# Patient Record
Sex: Male | Born: 1980 | Hispanic: Yes | Marital: Married
Health system: Southern US, Community
[De-identification: ages and names within clinical notes are randomized; demographics above are authoritative.]

---

## 2018-10-20 ENCOUNTER — Other Ambulatory Visit: Payer: Self-pay

## 2018-10-20 ENCOUNTER — Emergency Department: Payer: Worker's Compensation

## 2018-10-20 ENCOUNTER — Encounter: Payer: Self-pay | Admitting: Emergency Medicine

## 2018-10-20 ENCOUNTER — Emergency Department
Admission: EM | Admit: 2018-10-20 | Discharge: 2018-10-20 | Disposition: A | Discharge status: Home / Self Care | Payer: Worker's Compensation | Attending: Emergency Medicine | Admitting: Emergency Medicine

## 2018-10-20 DIAGNOSIS — M25551 Pain in right hip: Secondary | ICD-10-CM | POA: Diagnosis not present

## 2018-10-20 DIAGNOSIS — W11XXXA Fall on and from ladder, initial encounter: Secondary | ICD-10-CM

## 2018-10-20 MED ORDER — OXYCODONE HCL 5 MG PO TABS
5.0000 mg | ORAL_TABLET | Freq: Once | ORAL | Status: AC
  Administered 2018-10-20: 5 mg via ORAL
  Filled 2018-10-20: qty 1

## 2018-10-20 MED ORDER — TRAMADOL HCL 50 MG PO TABS: 50.0000 mg | ORAL_TABLET | Freq: Four times a day (QID) | ORAL | 0 refills | Status: AC | PRN

## 2018-10-20 MED ORDER — ACETAMINOPHEN 500 MG PO TABS
1000.0000 mg | ORAL_TABLET | Freq: Once | ORAL | Status: AC
  Administered 2018-10-20: 13:00:00 1000 mg via ORAL
  Filled 2018-10-20: qty 2

## 2018-10-20 NOTE — Discharge Instructions (Addendum)
You were seen in the emergency department after a fall. Luckily all of your imaging studies did not show any evidence of injuries. Follow-up with you doctor within the next 2-3 days for further evaluation. Sometimes injuries can present at a later time and therefore it is imperative that you return to the emergency room if you have a severe headache, facial droop, neck pain, numbness or weakness of your extremities, slurred speech, difficulty finding words, chest pain, back pain, abdominal pain, or any other new symptoms that were not present during this visit. You may take Tylenol 1000mg  every 8 hours and 50mg  of tramadol as needed for breakthrough pain

## 2018-10-20 NOTE — ED Triage Notes (Signed)
Pt presents to ED via POV with c/o fall from [facility]. Per [facility] RN pt fell approx 74ft approx 1 hr PTA. Pt c/o lower back pain, pain worse with breathing. Pt denies pain with palpation of C-spine, pt c/o pain with palpation of lower back. Pt also c/o pain with walker, no LOC at this time. Pt is alert and oriented at this time.

## 2018-10-20 NOTE — ED Notes (Signed)
This RN contacted Josh at [employer] about pt's condition and WC requests. Denies needing any bloodwork or urine.

## 2018-10-20 NOTE — ED Notes (Signed)
Patient transported to CT 

## 2018-10-20 NOTE — ED Provider Notes (Signed)
[facility] Emergency Department Provider Note  ____________________________________________  Time seen: Approximately 1:06 PM  I have reviewed the triage vital signs and the nursing notes.   HISTORY  Chief Complaint Fall   HPI Tyler Short is a 38 y.o. male no significant past medical history who presents for evaluation after mechanical fall.  Patient reports that he was walking down a 10 foot ladder with a heavy backpack when he lost his balance and fell backwards.  He reports falling on top of the backpack onto a wood floor.  He denies head trauma.  He is complaining of left upper back pain and right lower back pain.  Upper back pain is mild, the lower back pain is moderate and worse with movement of his right lower extremity.  He denies midline back pain, he denies chest pain or abdominal pain, he denies any extremity pain, he denies headache or neck pain.  PMH None - reviewed  Allergies Patient has no known allergies.  No family history on file.  Social History Smoking - no Drugs - no Alcohol - no  Review of Systems Constitutional: Negative for fever. Eyes: Negative for visual changes. ENT: Negative for facial injury or neck injury Cardiovascular: Negative for chest injury. Respiratory: Negative for shortness of breath. Negative for chest wall injury. Gastrointestinal: Negative for abdominal pain or injury. Genitourinary: Negative for dysuria. Musculoskeletal: + back pain, negative for arm or leg pain. Skin: Negative for laceration/abrasions. Neurological: Negative for head injury.  ____________________________________________   PHYSICAL EXAM:  VITAL SIGNS: ED Triage Vitals  Enc Vitals Group     BP 10/20/18 1223 112/75     Pulse Rate 10/20/18 1223 63     Resp 10/20/18 1223 18     Temp 10/20/18 1223 97.9 F (36.6 C)     Temp Source 10/20/18 1223 Oral     SpO2 10/20/18 1223 99 %     Weight 10/20/18 1222 190 lb  (86.2 kg)     Height 10/20/18 1222 5\' 6"  (1.676 m)     Head Circumference --      Peak Flow --      Pain Score 10/20/18 1222 8     Pain Loc --      Pain Edu? --      Excl. in GC? --    Full spinal precautions maintained throughout the trauma exam. Constitutional: Alert and oriented. No acute distress. Does not appear intoxicated. HEENT Head: Normocephalic and atraumatic. Face: No facial bony tenderness. Stable midface Ears: No hemotympanum bilaterally. No Battle sign Eyes: No eye injury. PERRL. No raccoon eyes Nose: Nontender. No epistaxis. No rhinorrhea Mouth/Throat: Mucous membranes are moist. No oropharyngeal blood. No dental injury. Airway patent without stridor. Normal voice. Neck: no C-collar in place. No midline c-spine tenderness.  Cardiovascular: Normal rate, regular rhythm. Normal and symmetric distal pulses are present in all extremities. Pulmonary/Chest: Chest wall is stable and nontender to palpation/compression. Normal respiratory effort. Breath sounds are normal. No crepitus. Tender to palpation over the L lateral upper back Abdominal: Soft, nontender, non distended. Musculoskeletal: Nontender with normal full range of motion in all extremities. No deformities. No thoracic or lumbar midline spinal tenderness. Tender to palpation on the R lower paraspinal region. Pelvis is stable. Skin: Skin is warm, dry and intact. No abrasions or contutions. Psychiatric: Speech and behavior are appropriate. Neurological: Normal speech and language. Moves all extremities to command. No gross focal neurologic deficits are appreciated.  Glascow Coma Score: 4 -  Opens eyes on own 6 - Follows simple motor commands 5 - Alert and oriented GCS: 15   ____________________________________________   LABS (all labs ordered are listed, but only abnormal results are displayed)  Labs Reviewed - No data to display ____________________________________________  EKG  none   ____________________________________________  RADIOLOGY  I have personally reviewed the images performed during this visit and I agree with the Radiologist's read.   Interpretation by Radiologist:  Dg Chest 2 View  Result Date: 10/20/2018 CLINICAL DATA:  Back pain after fall. EXAM: CHEST - 2 VIEW COMPARISON:  None. FINDINGS: The heart size and mediastinal contours are within normal limits. Both lungs are clear. No pneumothorax or pleural effusion is noted. The visualized skeletal structures are unremarkable. IMPRESSION: No active cardiopulmonary disease. Electronically Signed   By: Lupita Raider, M.D.   On: 10/20/2018 13:41   Dg Lumbar Spine Complete  Result Date: 10/20/2018 CLINICAL DATA:  Back pain after fall. EXAM: LUMBAR SPINE - COMPLETE 4+ VIEW COMPARISON:  None. FINDINGS: There is no evidence of lumbar spine fracture. Alignment is normal. Minimal anterior osteophyte formation is noted at L2-3, L3-4 and L4-5. Intervertebral disc spaces are maintained. IMPRESSION: Minimal degenerative changes as described above. No acute abnormality seen in the lumbar spine. Electronically Signed   By: Lupita Raider, M.D.   On: 10/20/2018 13:42   Ct Head Wo Contrast  Result Date: 10/20/2018 CLINICAL DATA:  38 year old male s/p 10 foot fall EXAM: CT HEAD WITHOUT CONTRAST TECHNIQUE: Contiguous axial images were obtained from the base of the skull through the vertex without intravenous contrast. COMPARISON:  None. FINDINGS: Brain: No evidence of acute infarction, hemorrhage, hydrocephalus, extra-axial collection or mass lesion/mass effect. Vascular: No hyperdense vessel or unexpected calcification. Skull: Normal. Negative for fracture or focal lesion. Sinuses/Orbits: No acute finding. Other: None. IMPRESSION: Negative head CT. Electronically Signed   By: Malachy Moan M.D.   On: 10/20/2018 13:24   Dg Hip Unilat W Or Wo Pelvis 2-3 Views Right  Result Date: 10/20/2018 CLINICAL DATA:  Right hip pain after  fall. EXAM: DG HIP (WITH OR WITHOUT PELVIS) 2-3V RIGHT COMPARISON:  None. FINDINGS: There is no evidence of hip fracture or dislocation. There is no evidence of arthropathy or other focal bone abnormality. IMPRESSION: Negative. Electronically Signed   By: Lupita Raider, M.D.   On: 10/20/2018 13:43      ____________________________________________   PROCEDURES  Procedure(s) performed: None Procedures Critical Care performed:  None ____________________________________________   INITIAL IMPRESSION / ASSESSMENT AND PLAN / ED COURSE   38 y.o. male no significant past medical history who presents for evaluation after mechanical fall.  Patient fell 10 feet from a ladder backwards on top of the heavy backpack onto the wood flooring.  No LOC.  X-rays of chest, lumbar spine, pelvis and right hip and CT head are pending.    _________________________ 1:56 PM on 10/20/2018 -----------------------------------------  Imaging with no acute findings.  Patient is able to ambulate with well-controlled pain.  Will discharge home on standing Tylenol, tramadol as needed, and follow-up with primary care doctor.  Discussed my standard return precautions with patient.   As part of my medical decision making, I reviewed the following data within the electronic MEDICAL RECORD NUMBER Nursing notes reviewed and incorporated, Old chart reviewed, Radiograph reviewed , Notes from prior ED visits and [place] Controlled Substance Database    Pertinent labs & imaging results that were available during my care of the patient were reviewed by  me and considered in my medical decision making (see chart for details).    ____________________________________________   FINAL CLINICAL IMPRESSION(S) / ED DIAGNOSES  Final diagnoses:  Fall from ladder, initial encounter  Pain of right hip joint      NEW MEDICATIONS STARTED DURING THIS VISIT:  ED Discharge Orders         Ordered    traMADol (ULTRAM) 50 MG tablet  Every 6  hours PRN     10/20/18 1355           Note:  This document was prepared using Dragon voice recognition software and may include unintentional dictation errors.    Don Perking, [place], MD 10/20/18 1356

## 2019-10-24 ENCOUNTER — Ambulatory Visit: Payer: Self-pay | Attending: Internal Medicine

## 2019-10-24 DIAGNOSIS — Z23 Encounter for immunization: Secondary | ICD-10-CM

## 2019-10-24 NOTE — Progress Notes (Signed)
   Covid-19 Vaccination Clinic  Name:  Tyler Short    MRN: [id number] DOB: 1981-06-23  10/24/2019  Mr. Tyler Short was observed post Covid-19 immunization for 15 minutes without incident. He was provided with Vaccine Information Sheet and instruction to access the V-Safe system.   Mr. Tyler Short was instructed to call 911 with any severe reactions post vaccine: Marland Kitchen Difficulty breathing  . Swelling of face and throat  . A fast heartbeat  . A bad rash all over body  . Dizziness and weakness   Immunizations Administered    Name Date Dose VIS Date Route   Pfizer COVID-19 Vaccine 10/24/2019  1:43 PM 0.3 mL 07/01/2019 Intramuscular   Manufacturer: [employer], [employer]   Lot: [phone] [id number]   NDC: [address]-5

## 2019-11-14 ENCOUNTER — Ambulatory Visit: Payer: Self-pay | Attending: Internal Medicine

## 2019-11-14 DIAGNOSIS — Z23 Encounter for immunization: Secondary | ICD-10-CM

## 2019-11-14 NOTE — Progress Notes (Signed)
   Covid-19 Vaccination Clinic  Name:  Tyler Short    MRN: [id number] DOB: [birth year]  11/14/2019  Mr. Tyler Short was observed post Covid-19 immunization for 15 minutes without incident. He was provided with Vaccine Information Sheet and instruction to access the V-Safe system.   Mr. Tyler Short was instructed to call 911 with any severe reactions post vaccine: Tyler Short Kitchen Difficulty breathing  . Swelling of face and throat  . A fast heartbeat  . A bad rash all over body  . Dizziness and weakness   Immunizations Administered    Name Date Dose VIS Date Route   Pfizer COVID-19 Vaccine 11/14/2019  1:37 PM 0.3 mL 09/14/2018 Intramuscular   Manufacturer: [employer], [employer]   Lot: [id number]   NDC: [address]-9

## 2020-08-21 IMAGING — CR CHEST - 2 VIEW
2 series · 2 of 2 positions shown · non-contrast
Comparison: None.

CLINICAL DATA: Back pain after fall.

EXAM:
CHEST - 2 VIEW

[chest pa]
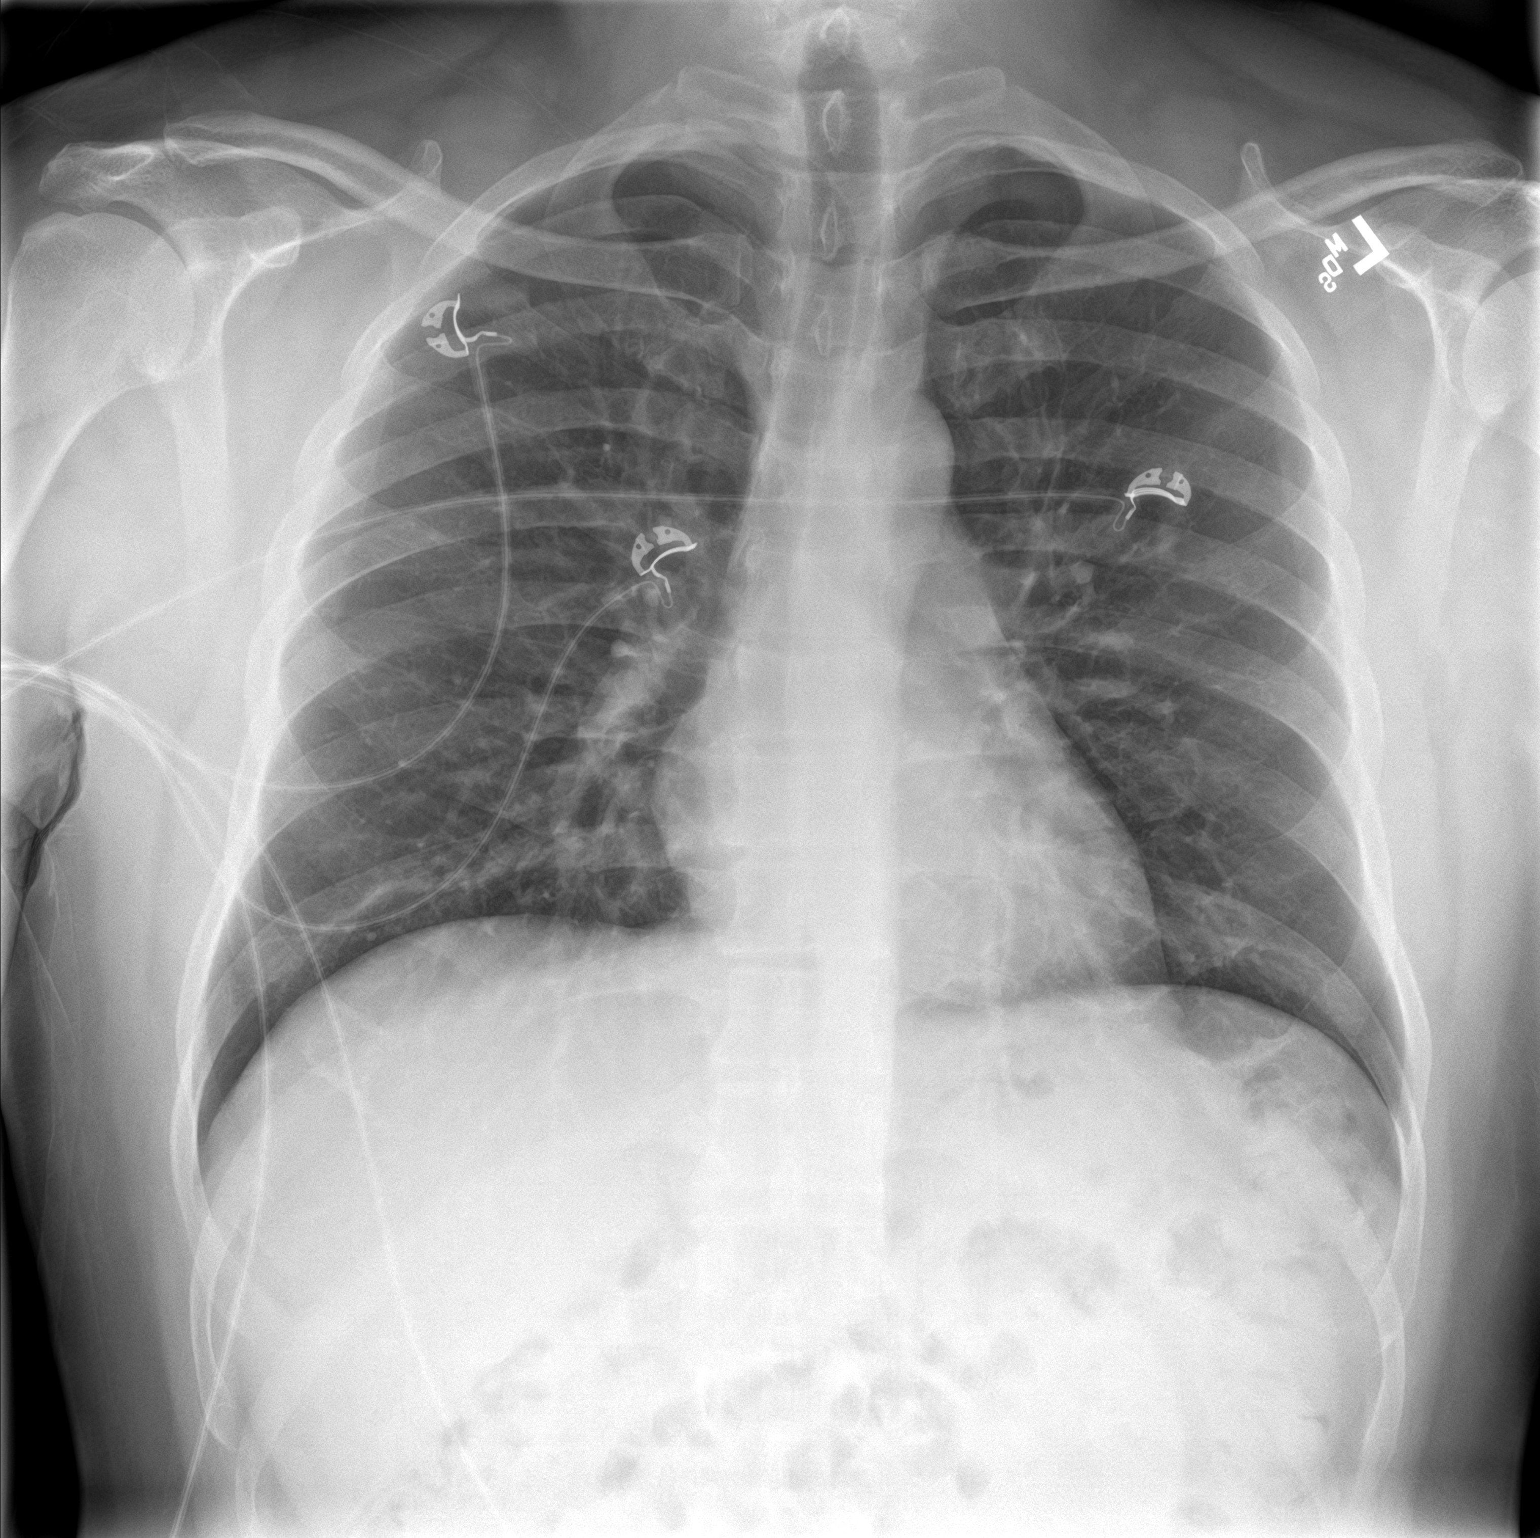

[chest lat]
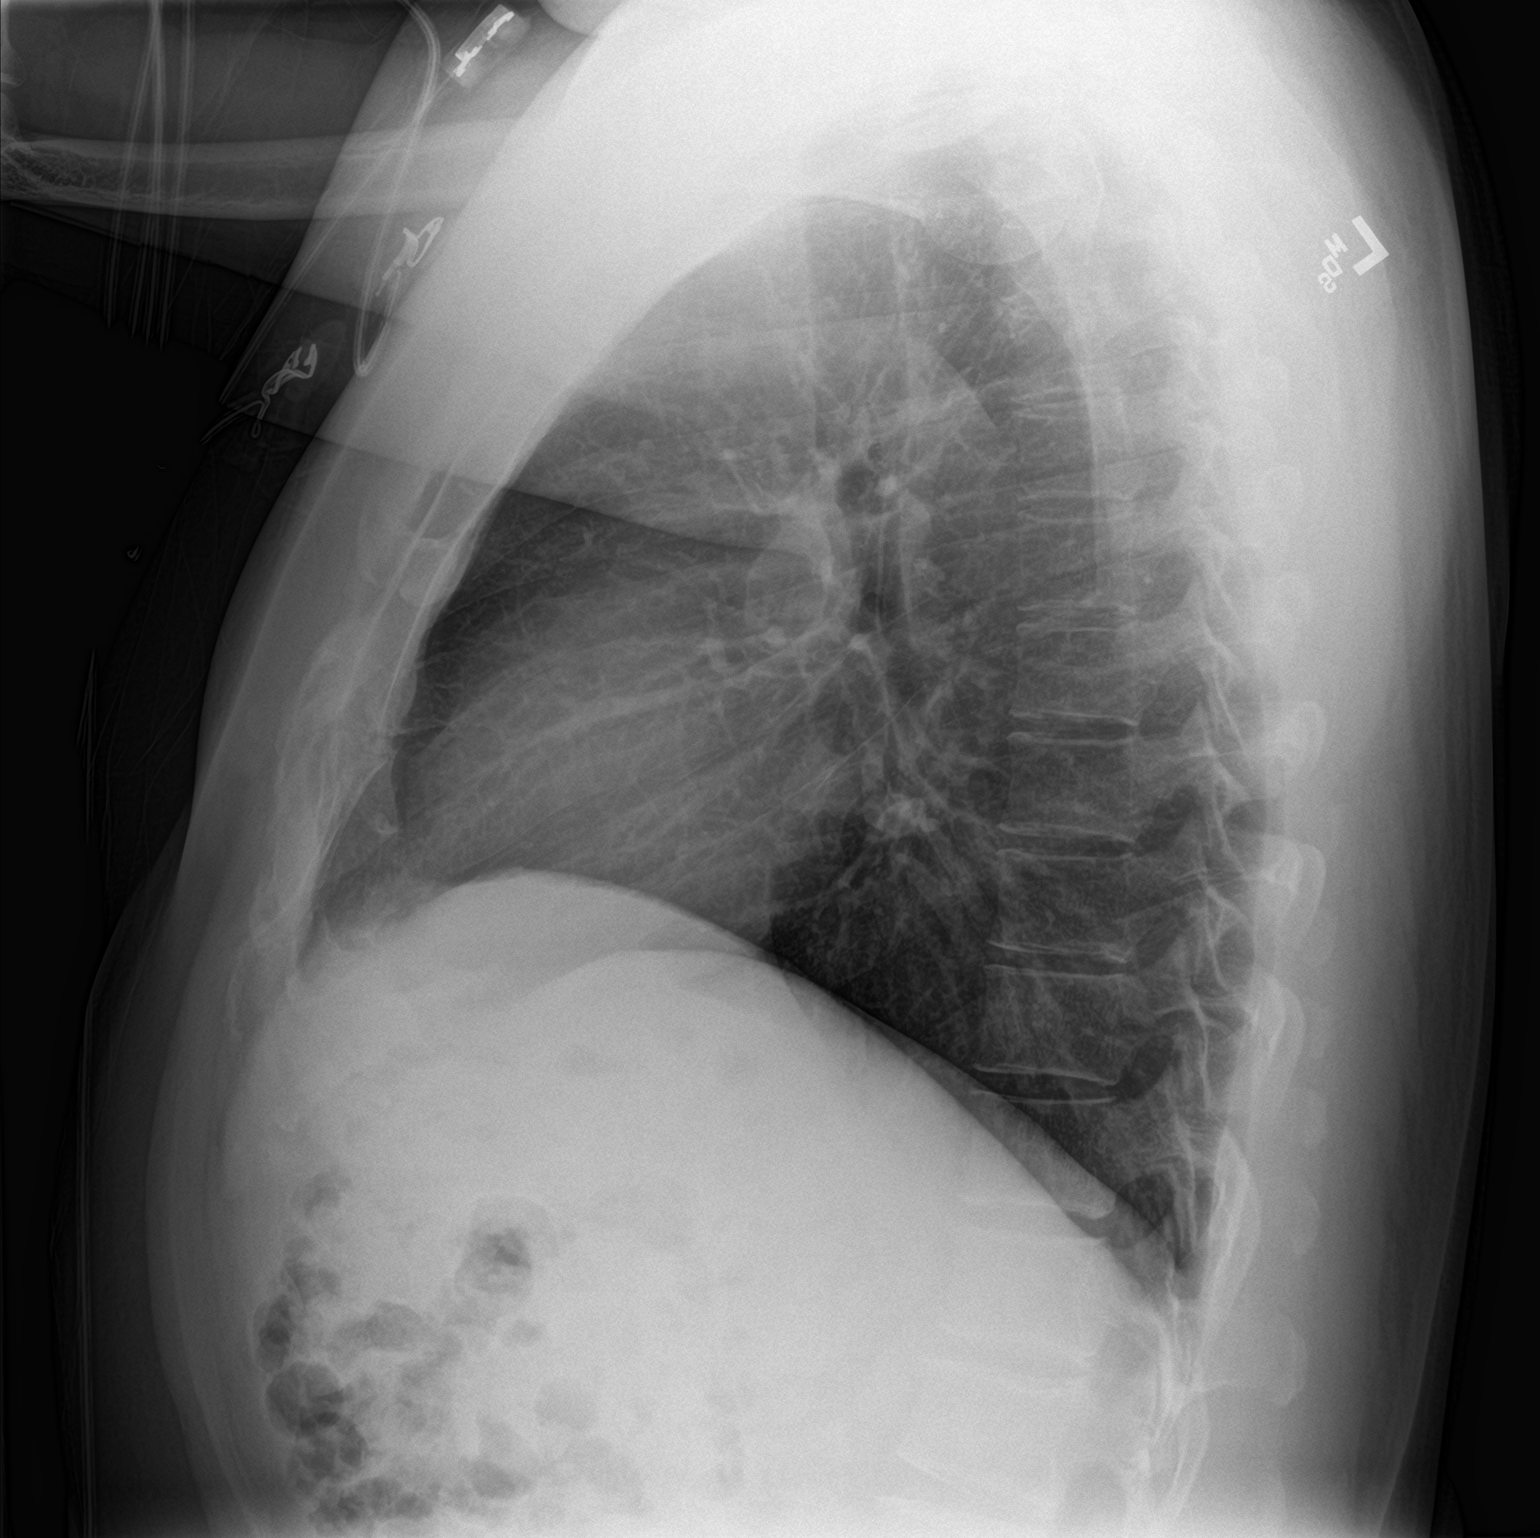

[2 of 2 positions shown; findings below may reference images not displayed]

FINDINGS: The heart size and mediastinal contours are within normal limits.
Both lungs are clear. No pneumothorax or pleural effusion is noted.
The visualized skeletal structures are unremarkable.
IMPRESSION: No active cardiopulmonary disease.

## 2023-07-19 ENCOUNTER — Emergency Department
Admission: EM | Admit: 2023-07-19 | Discharge: 2023-07-19 | Disposition: A | Discharge status: Home / Self Care | Payer: BC Managed Care – PPO | Attending: Student in an Organized Health Care Education/Training Program | Admitting: Student in an Organized Health Care Education/Training Program

## 2023-07-19 ENCOUNTER — Other Ambulatory Visit: Payer: Self-pay

## 2023-07-19 DIAGNOSIS — Y9389 Activity, other specified: Secondary | ICD-10-CM | POA: Diagnosis not present

## 2023-07-19 DIAGNOSIS — S6992XA Unspecified injury of left wrist, hand and finger(s), initial encounter: Secondary | ICD-10-CM | POA: Diagnosis present

## 2023-07-19 DIAGNOSIS — W260XXA Contact with knife, initial encounter: Secondary | ICD-10-CM | POA: Diagnosis not present

## 2023-07-19 DIAGNOSIS — S61412A Laceration without foreign body of left hand, initial encounter: Secondary | ICD-10-CM | POA: Diagnosis not present

## 2023-07-19 MED ORDER — LIDOCAINE HCL (PF) 1 % IJ SOLN
5.0000 mL | Freq: Once | INTRAMUSCULAR | Status: AC
  Administered 2023-07-19: 5 mL via INTRADERMAL
  Filled 2023-07-19: qty 5

## 2023-07-19 NOTE — ED Provider Notes (Signed)
[facility] Provider Note    Event Date/Time   First MD Initiated Contact with Patient 07/19/23 2205     (approximate)   History   Laceration   HPI  Tyler Short is a 42 y.o. male with no PMH who presents for evaluation of laceration to the left hand.  Patient was using a kitchen knife to open a package when he cut the thenar eminence of his left hand.  Actively bleeding.  Patient is up-to-date on his tetanus shot.      Physical Exam   Triage Vital Signs: ED Triage Vitals  Encounter Vitals Group     BP 07/19/23 1952 (!) 145/101     Systolic BP Percentile --      Diastolic BP Percentile --      Pulse Rate 07/19/23 1952 100     Resp 07/19/23 1952 20     Temp 07/19/23 1952 97.9 F (36.6 C)     Temp Source 07/19/23 1952 Oral     SpO2 07/19/23 1952 100 %     Weight 07/19/23 1951 200 lb (90.7 kg)     Height 07/19/23 1951 5\' 7"  (1.702 m)     Head Circumference --      Peak Flow --      Pain Score 07/19/23 1951 8     Pain Loc --      Pain Education --      Exclude from Growth Chart --     Most recent vital signs: Vitals:   07/19/23 1952  BP: (!) 145/101  Pulse: 100  Resp: 20  Temp: 97.9 F (36.6 C)  SpO2: 100%   General: Awake, no distress.  CV:  Good peripheral perfusion.  Resp:  Normal effort.  Abd:  No distention.  Other:  Approximately 2 cm laceration to the thenar eminence of the left hand.  Hand ROM maintained, radial pulse 2+ and regular, capillary refill in left thumb appropriate.   ED Results / Procedures / Treatments   Labs (all labs ordered are listed, but only abnormal results are displayed) Labs Reviewed - No data to display   PROCEDURES:  Critical Care performed: No  .Laceration Repair  Date/Time: 07/19/2023 10:39 PM  Performed by: Cameron Ali, PA-C Authorized by: Cameron Ali, PA-C   Consent:    Consent obtained:  Verbal   Consent given by:  Patient   Risks, benefits, and  alternatives were discussed: yes     Risks discussed:  Infection, pain, poor cosmetic result and poor wound healing   Alternatives discussed:  No treatment Universal protocol:    Patient identity confirmed:  Verbally with patient Anesthesia:    Anesthesia method:  Local infiltration   Local anesthetic:  Lidocaine 1% w/o epi Laceration details:    Location:  Hand   Hand location:  L palm   Length (cm):  2   Depth (mm):  3 Pre-procedure details:    Preparation:  Patient was prepped and draped in usual sterile fashion Exploration:    Wound exploration: entire depth of wound visualized   Treatment:    Area cleansed with:  Povidone-iodine   Amount of cleaning:  Standard   Irrigation solution:  Sterile saline   Irrigation volume:  30 mL   Irrigation method:  Syringe Skin repair:    Repair method:  Sutures   Suture size:  4-0   Suture material:  Prolene   Suture technique:  Simple interrupted   Number of  sutures:  5 Approximation:    Approximation:  Close Repair type:    Repair type:  Simple Post-procedure details:    Dressing:  Adhesive bandage   Procedure completion:  Tolerated well, no immediate complications    MEDICATIONS ORDERED IN ED: Medications  lidocaine (PF) (XYLOCAINE) 1 % injection 5 mL (has no administration in time range)     IMPRESSION / MDM / ASSESSMENT AND PLAN / ED COURSE  I reviewed the triage vital signs and the nursing notes.                             42 year old male presents for evaluation of laceration to the left hand.  Patient was hypertensive in triage otherwise vital signs are stable.  Patient NAD on exam.  Differential diagnosis includes, but is not limited to, laceration.  Patient's presentation is most consistent with acute, uncomplicated illness.  Laceration repaired as described in the procedure note above.  Patient was instructed on wound care.  He will need to have the stitches removed in 7 to 10 days.  He can have Tylenol and  ibuprofen for pain.  He was given a note for work.  He voiced understanding, all questions were answered and he is stable at discharge.      FINAL CLINICAL IMPRESSION(S) / ED DIAGNOSES   Final diagnoses:  Laceration of left hand without foreign body, initial encounter     Rx / DC Orders   ED Discharge Orders     None        Note:  This document was prepared using Dragon voice recognition software and may include unintentional dictation errors.   Cameron Ali, PA-C 07/19/23 2241    Willy Eddy, MD 07/19/23 [phone]

## 2023-07-19 NOTE — Discharge Instructions (Signed)
Your stitches will need to be removed in 7 to 10 days.  This can be done at urgent care, your primary care provider or the emergency department.  Please wash the wound daily with soap and water then cover with a bandage.  If the bandage gets wet please change it out for dry one.  For a wound with stitches to heal well it needs to stay clean and dry.  Watch for signs of infection including redness, warmth, swelling, pain and pus drainage.  If you develop any of these please return to the ED, urgent care or your primary care provider.  You can take 650 mg of Tylenol and 600 mg of ibuprofen every 6 hours as needed for pain.

## 2023-07-19 NOTE — ED Provider Triage Note (Signed)
Emergency Medicine Provider Triage Evaluation Note  Kenai Hauptman , a 42 y.o. male  was evaluated in triage.  Pt complains of cutting his hand with a knife opening something.  Review of Systems  Positive: Hand pain Negative:   Physical Exam  BP (!) 145/101   Pulse 100   Temp 97.9 F (36.6 C) (Oral)   Resp 20   Ht 5\' 7"  (1.702 m)   Wt 90.7 kg   SpO2 100%   BMI 31.32 kg/m  Gen:   Awake, no distress   Resp:  Normal effort MSK:   Moves extremities without difficulty  Other:  Capillary refill and sensation intact  Medical Decision Making  Medically screening exam initiated at 7:53 PM.  Appropriate orders placed.  Paulette Blanch was informed that the remainder of the evaluation will be completed by another provider, this initial triage assessment does not replace that evaluation, and the importance of remaining in the ED until their evaluation is complete.     Cameron Ali, PA-C 07/19/23 1954

## 2023-07-19 NOTE — ED Triage Notes (Signed)
Pt reports trying to open box with knife and cut his left thumb near his palm, pt has aprox 2 inch lac. Lac wrapped in triage.
